# Patient Record
Sex: Male | Born: 1960 | Race: White | Hispanic: No | Marital: Married | Smoking: Former smoker
Health system: Southern US, Community
[De-identification: ages and names within clinical notes are randomized; demographics above are authoritative.]

## PROBLEM LIST (undated history)

## (undated) DIAGNOSIS — I10 Essential (primary) hypertension: Secondary | ICD-10-CM

## (undated) DIAGNOSIS — I219 Acute myocardial infarction, unspecified: Secondary | ICD-10-CM

## (undated) DIAGNOSIS — E119 Type 2 diabetes mellitus without complications: Secondary | ICD-10-CM

## (undated) HISTORY — PX: APPENDECTOMY: SHX54

---

## 2005-01-11 ENCOUNTER — Emergency Department (HOSPITAL_COMMUNITY): Admission: EM | Admit: 2005-01-11 | Discharge: 2005-01-11 | Payer: Self-pay | Admitting: Emergency Medicine

## 2005-03-13 ENCOUNTER — Emergency Department (HOSPITAL_COMMUNITY): Admission: EM | Admit: 2005-03-13 | Discharge: 2005-03-13 | Payer: Self-pay | Admitting: Emergency Medicine

## 2015-12-09 ENCOUNTER — Emergency Department (HOSPITAL_COMMUNITY)
Admission: EM | Admit: 2015-12-09 | Discharge: 2015-12-10 | Disposition: A | Discharge status: Home / Self Care | Payer: Worker's Compensation | Attending: Emergency Medicine | Admitting: Emergency Medicine

## 2015-12-09 ENCOUNTER — Encounter (HOSPITAL_COMMUNITY): Payer: Self-pay | Admitting: *Deleted

## 2015-12-09 ENCOUNTER — Emergency Department (HOSPITAL_COMMUNITY): Payer: Worker's Compensation

## 2015-12-09 DIAGNOSIS — W01198A Fall on same level from slipping, tripping and stumbling with subsequent striking against other object, initial encounter: Secondary | ICD-10-CM | POA: Insufficient documentation

## 2015-12-09 DIAGNOSIS — Y9289 Other specified places as the place of occurrence of the external cause: Secondary | ICD-10-CM | POA: Diagnosis not present

## 2015-12-09 DIAGNOSIS — Y998 Other external cause status: Secondary | ICD-10-CM | POA: Insufficient documentation

## 2015-12-09 DIAGNOSIS — Z7982 Long term (current) use of aspirin: Secondary | ICD-10-CM | POA: Insufficient documentation

## 2015-12-09 DIAGNOSIS — Z7984 Long term (current) use of oral hypoglycemic drugs: Secondary | ICD-10-CM | POA: Insufficient documentation

## 2015-12-09 DIAGNOSIS — S300XXA Contusion of lower back and pelvis, initial encounter: Secondary | ICD-10-CM | POA: Diagnosis not present

## 2015-12-09 DIAGNOSIS — M62830 Muscle spasm of back: Secondary | ICD-10-CM | POA: Insufficient documentation

## 2015-12-09 DIAGNOSIS — Z79899 Other long term (current) drug therapy: Secondary | ICD-10-CM | POA: Diagnosis not present

## 2015-12-09 DIAGNOSIS — Z87891 Personal history of nicotine dependence: Secondary | ICD-10-CM | POA: Insufficient documentation

## 2015-12-09 DIAGNOSIS — E119 Type 2 diabetes mellitus without complications: Secondary | ICD-10-CM | POA: Diagnosis not present

## 2015-12-09 DIAGNOSIS — W19XXXA Unspecified fall, initial encounter: Secondary | ICD-10-CM

## 2015-12-09 DIAGNOSIS — Y9389 Activity, other specified: Secondary | ICD-10-CM | POA: Insufficient documentation

## 2015-12-09 DIAGNOSIS — Z7902 Long term (current) use of antithrombotics/antiplatelets: Secondary | ICD-10-CM | POA: Diagnosis not present

## 2015-12-09 DIAGNOSIS — S3992XA Unspecified injury of lower back, initial encounter: Secondary | ICD-10-CM | POA: Diagnosis present

## 2015-12-09 DIAGNOSIS — I10 Essential (primary) hypertension: Secondary | ICD-10-CM | POA: Diagnosis not present

## 2015-12-09 DIAGNOSIS — I252 Old myocardial infarction: Secondary | ICD-10-CM | POA: Insufficient documentation

## 2015-12-09 HISTORY — DX: Type 2 diabetes mellitus without complications: E11.9

## 2015-12-09 HISTORY — DX: Acute myocardial infarction, unspecified: I21.9

## 2015-12-09 HISTORY — DX: Essential (primary) hypertension: I10

## 2015-12-09 MED ORDER — HYDROMORPHONE HCL 1 MG/ML IJ SOLN
1.0000 mg | Freq: Once | INTRAMUSCULAR | Status: AC
  Administered 2015-12-09: 1 mg via INTRAVENOUS
  Filled 2015-12-09: qty 1

## 2015-12-09 MED ORDER — ONDANSETRON HCL 4 MG/2ML IJ SOLN
4.0000 mg | Freq: Once | INTRAMUSCULAR | Status: AC
  Administered 2015-12-09: 4 mg via INTRAVENOUS
  Filled 2015-12-09: qty 2

## 2015-12-09 MED ORDER — DIAZEPAM 5 MG/ML IJ SOLN
5.0000 mg | Freq: Once | INTRAMUSCULAR | Status: AC
  Administered 2015-12-09: 5 mg via INTRAVENOUS
  Filled 2015-12-09: qty 2

## 2015-12-09 NOTE — ED Notes (Addendum)
Per [facility], pt fell & c/o LBP & left flank pain.  Pt received fentanyl enroute

## 2015-12-09 NOTE — ED Notes (Signed)
Patient transported to X-ray 

## 2015-12-09 NOTE — ED Notes (Signed)
Bed: WA02 Expected date:  Expected time:  Means of arrival:  Comments: EMS 55 yo male fall

## 2015-12-09 NOTE — ED Provider Notes (Signed)
CSN: [id number]     Arrival date & time 12/09/15  2201 History   First MD Initiated Contact with Patient 12/09/15 2236     Chief Complaint  Patient presents with  . Fall  . Back Pain   HPI  [name] is an 55 y.o. male with history of HTN, DM, MI who presents to the ED for evaluation of back pain after a fall. He states he was standing on a pickup truck and took a step back, not realizing he was at the edge of the be. He states he fell at an angle and hit his lower back but more on the left side. He states initially he was able to walk back inside but once he laid down the pain was so severe he could not get out of bed for two hours. Denies numbness or weakness of his leg. Denies bowel/bladder incontinence. Denies saddle paresthesias. Denies hitting his head, LOC. Denies headache or dizziness.  Past Medical History  Diagnosis Date  . Hypertension   . Diabetes mellitus without complication (HCC)   . MI (myocardial infarction) [facility](HCC)     Pt stated "it was in June or July 2016"   Past Surgical History  Procedure Laterality Date  . Appendectomy     No family history on file. Social History  Substance Use Topics  . Smoking status: Former Games developermoker  . Smokeless tobacco: None  . Alcohol Use: No    Review of Systems  All other systems reviewed and are negative.     Allergies  Other  Home Medications   Prior to Admission medications   Medication Sig Start Date End Date Taking? Authorizing Provider  aspirin 81 MG tablet Take 81 mg by mouth daily.   Yes Historical Provider, MD  Benzalkonium Chloride (MERTHIOLATE EX) Apply 1 application topically daily as needed (wound care).   Yes Historical Provider, MD  Cinnamon 500 MG TABS Take 1,000 mg by mouth 2 (two) times daily.   Yes Historical Provider, MD  clopidogrel (PLAVIX) 75 MG tablet Take 75 mg by mouth daily.   Yes Historical Provider, MD  isosorbide mononitrate (IMDUR) 30 MG 24 hr tablet Take 30 mg by mouth daily.   Yes Historical  Provider, MD  lovastatin (MEVACOR) 40 MG tablet Take 40 mg by mouth daily.   Yes Historical Provider, MD  metFORMIN (GLUCOPHAGE) 1000 MG tablet Take 1,000 mg by mouth 2 (two) times daily with a meal.   Yes Historical Provider, MD  nitroGLYCERIN (NITROSTAT) 0.4 MG SL tablet Place 0.4 mg under the tongue every 5 (five) minutes as needed for chest pain.   Yes Historical Provider, MD  omeprazole (PRILOSEC) 40 MG capsule Take 40 mg by mouth daily.   Yes Historical Provider, MD  valsartan-hydrochlorothiazide (DIOVAN-HCT) 80-12.5 MG tablet Take 1 tablet by mouth daily.   Yes Historical Provider, MD   BP 111/70 mmHg  Pulse 85  Temp(Src) 98.4 F (36.9 C) (Oral)  Resp 18  Ht 5\' 8"  (1.727 m)  Wt 112.492 kg  BMI 37.72 kg/m2  SpO2 99% Physical Exam  Constitutional: He is oriented to person, place, and time.  Appears very uncomfortable  HENT:  Right Ear: External ear normal.  Left Ear: External ear normal.  Nose: Nose normal.  Mouth/Throat: Oropharynx is clear and moist. No oropharyngeal exudate.  Eyes: Conjunctivae and EOM are normal. Pupils are equal, round, and reactive to light.  Neck: Normal range of motion. Neck supple.  Cardiovascular: Normal rate, regular rhythm, normal heart  sounds and intact distal pulses.   Pulmonary/Chest: Effort normal and breath sounds normal. No respiratory distress. He exhibits no tenderness.  Abdominal: Soft. Bowel sounds are normal. He exhibits no distension. There is no tenderness.  Musculoskeletal:  Marked l-spine midline tenderness and left paraspinal tenderness with spasm. No stepoff or deformity.   Neurological: He is alert and oriented to person, place, and time. He has normal reflexes. No cranial nerve deficit.  UE and LE strength and sensation intact bilaterally. Normal finger to nose.   Skin: Skin is warm and dry.  Psychiatric: He has a normal mood and affect.  Nursing note and vitals reviewed.   ED Course  Procedures (including critical care  time) Labs Review Labs Reviewed - No data to display  Imaging Review Dg Lumbar Spine Complete  12/10/2015  CLINICAL DATA:  Patient fell backwards 3 feet off of the tail gait of a truck. Low back pain worse on the left. EXAM: LUMBAR SPINE - COMPLETE 4+ VIEW COMPARISON:  None. FINDINGS: Normal alignment of the lumbar spine. Hypertrophic degenerative changes at the endplates with mildly narrowed lumbar interspaces. Degenerative changes in the facet joints. No vertebral compression deformities. No focal bone lesion or bone destruction. Bone cortex appears intact. Visualize sacrum appears intact. Surgical clips in the right lower quadrant. Mild degenerative changes in the hips. IMPRESSION: Mild degenerative changes in the lumbar spine. No acute displaced fractures identified. Electronically Signed   By: Burman Nieves M.D.   On: 12/10/2015 00:10   I have personally reviewed and evaluated these images and lab results as part of my medical decision-making.   EKG Interpretation None      MDM   Final diagnoses:  Fall, initial encounter  Contusion of lower back, initial encounter    XR negative for acute findings. Neuro exam nonfocal. Pain mildly improved with initial dose of pain meds. Will order another dilaudid, add toradol.   Pt reports significant improvement in pain after second dilaudid and toradol, though still quite tender. He is also concerned that he will not be able to pick up meds until AM. Will give additional dilaudid and give one dose PO Percocet to hopefully give enough relief to tie pt over until the AM when he can pick up meds. Will give rx for percocet, valium, and naproxen. Pt able to ambulate unassisted with steady gait. He is stable for discharge. Encouraged PCP f/u when pt returns home in [place]. ER return precautions given.   Carlene Coria, PA-C 12/10/15 1500  Tilden Fossa, MD 12/15/15 [phone]

## 2015-12-10 MED ORDER — DIAZEPAM 5 MG PO TABS: 5.0000 mg | ORAL_TABLET | Freq: Two times a day (BID) | ORAL | Status: AC

## 2015-12-10 MED ORDER — HYDROMORPHONE HCL 1 MG/ML IJ SOLN
1.0000 mg | Freq: Once | INTRAMUSCULAR | Status: AC
  Administered 2015-12-10: 1 mg via INTRAVENOUS
  Filled 2015-12-10: qty 1

## 2015-12-10 MED ORDER — KETOROLAC TROMETHAMINE 30 MG/ML IJ SOLN
30.0000 mg | Freq: Once | INTRAMUSCULAR | Status: AC
  Administered 2015-12-10: 30 mg via INTRAVENOUS
  Filled 2015-12-10: qty 1

## 2015-12-10 MED ORDER — OXYCODONE-ACETAMINOPHEN 5-325 MG PO TABS: 1.0000 | ORAL_TABLET | ORAL | Status: AC | PRN

## 2015-12-10 MED ORDER — NAPROXEN 500 MG PO TABS: 500.0000 mg | ORAL_TABLET | Freq: Two times a day (BID) | ORAL | Status: AC

## 2015-12-10 MED ORDER — OXYCODONE-ACETAMINOPHEN 5-325 MG PO TABS
1.0000 | ORAL_TABLET | Freq: Once | ORAL | Status: AC
  Administered 2015-12-10: 1 via ORAL
  Filled 2015-12-10: qty 1

## 2015-12-10 NOTE — Discharge Instructions (Signed)
Your x-ray today showed no evidence of fracture or dislocation. We were able to control your pain in the ER. Please follow up with your primary care provider as soon as possible when you return to [place]. In the meantime I will give you a few prescriptions to help with your pain. Return to the ER for new or worsening symptoms.

## 2015-12-10 NOTE — ED Notes (Signed)
Pt ambulated to BR

## 2017-12-23 IMAGING — CR DG LUMBAR SPINE COMPLETE 4+V
5 series · 5 of 5 positions shown · non-contrast
Comparison: None.

CLINICAL DATA: Patient fell backwards 3 feet off of the tail gait
of a truck. Low back pain worse on the left.

EXAM:
LUMBAR SPINE - COMPLETE 4+ VIEW

[t lumbar spine ap]
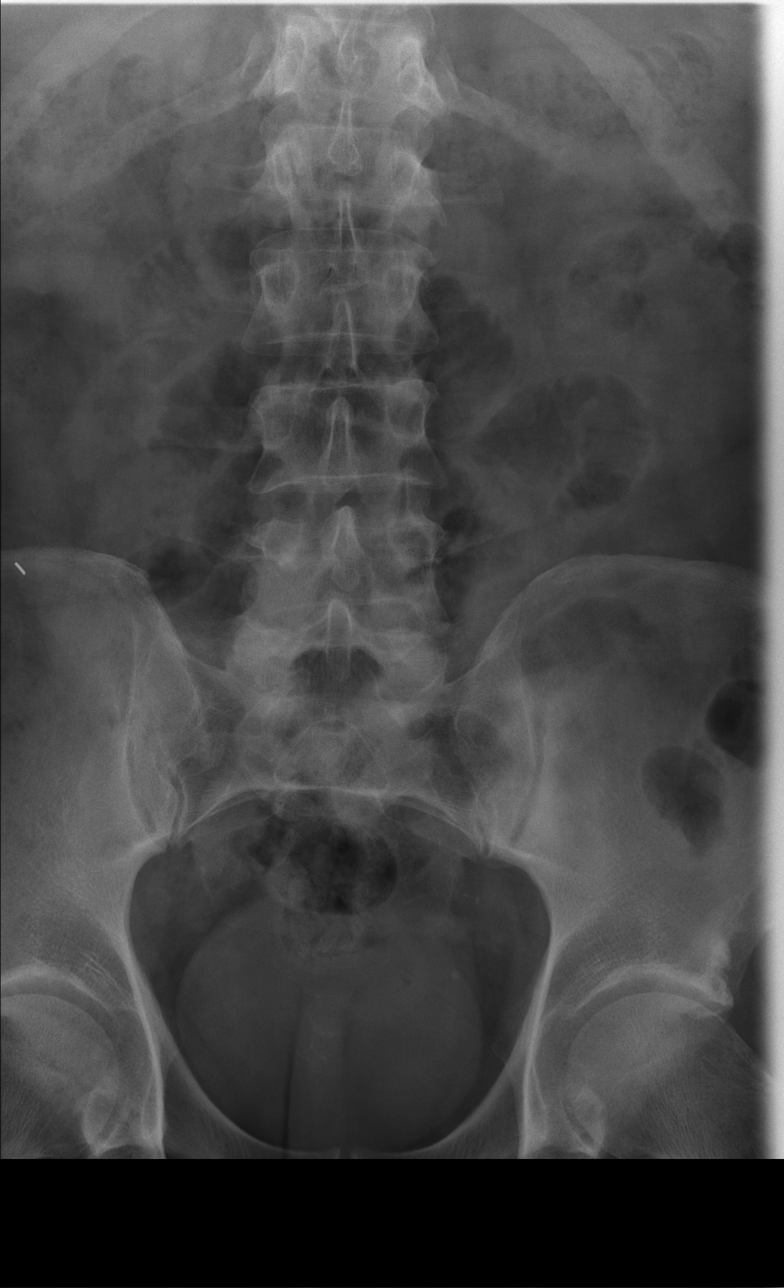

[t lumbar spine obl (1 of 2)]
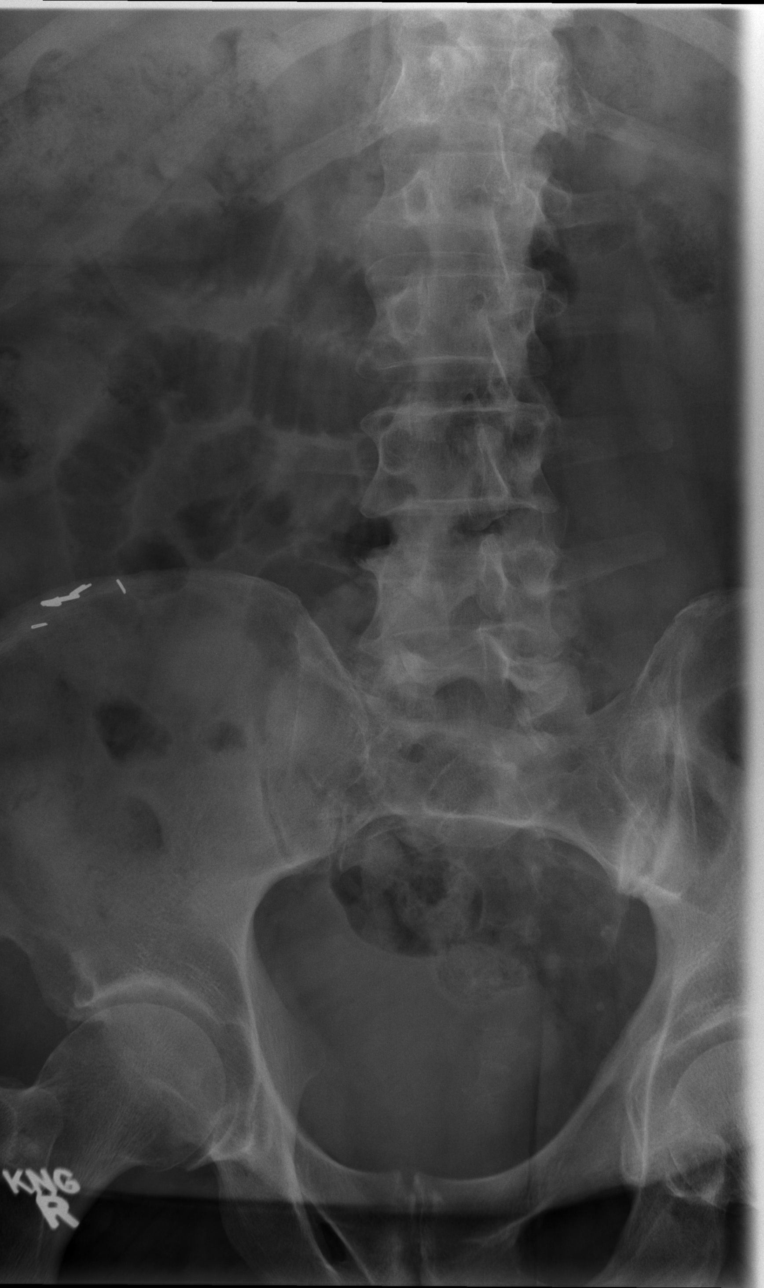

[t lumbar spine obl (2 of 2)]
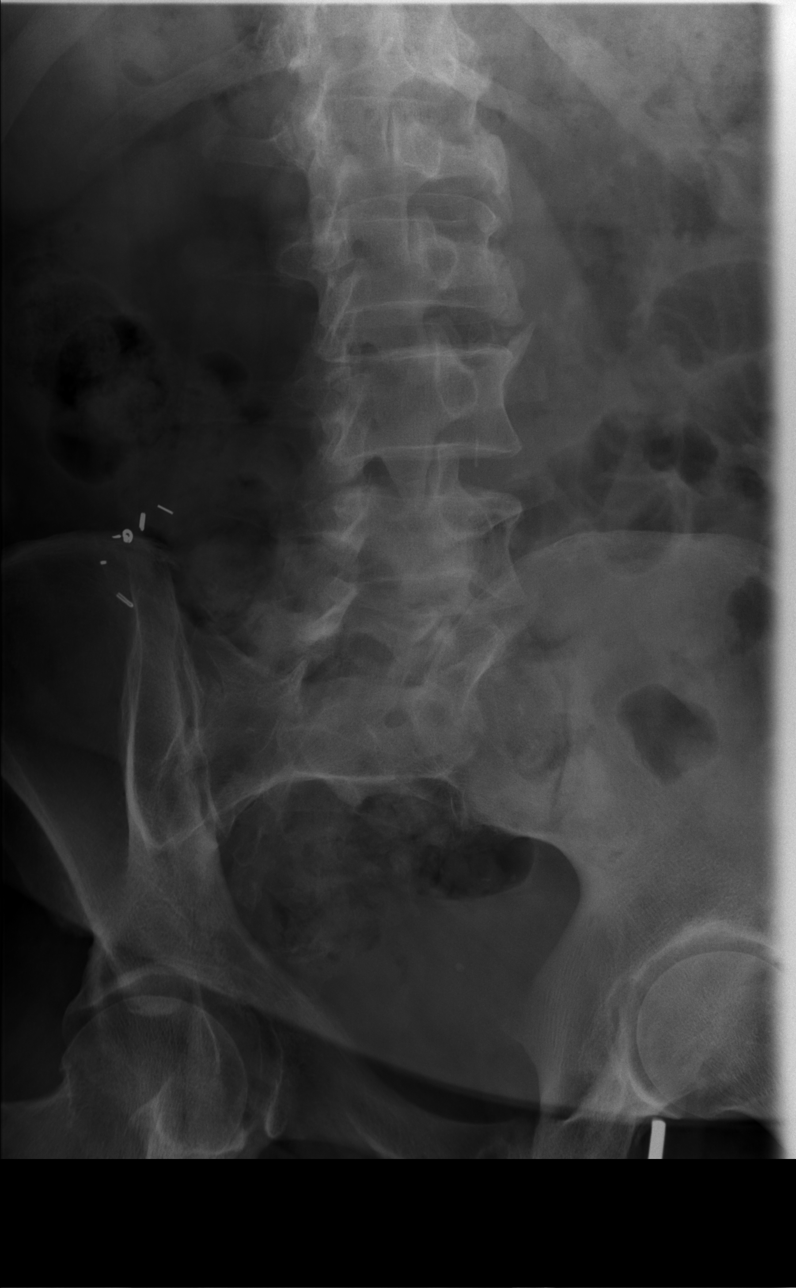

[t lumbar spine lat]
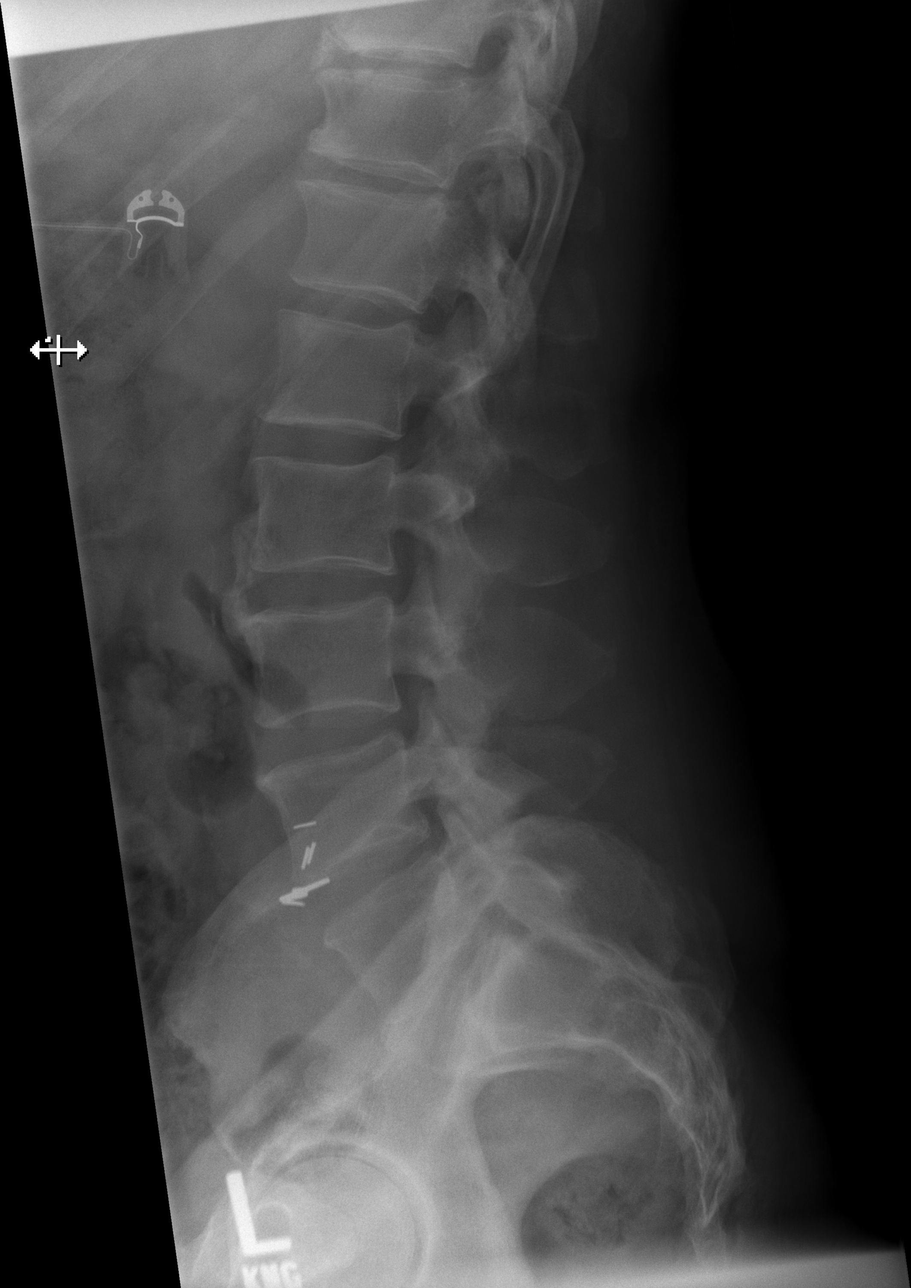

[t lumbar l-5 s-1 spot]
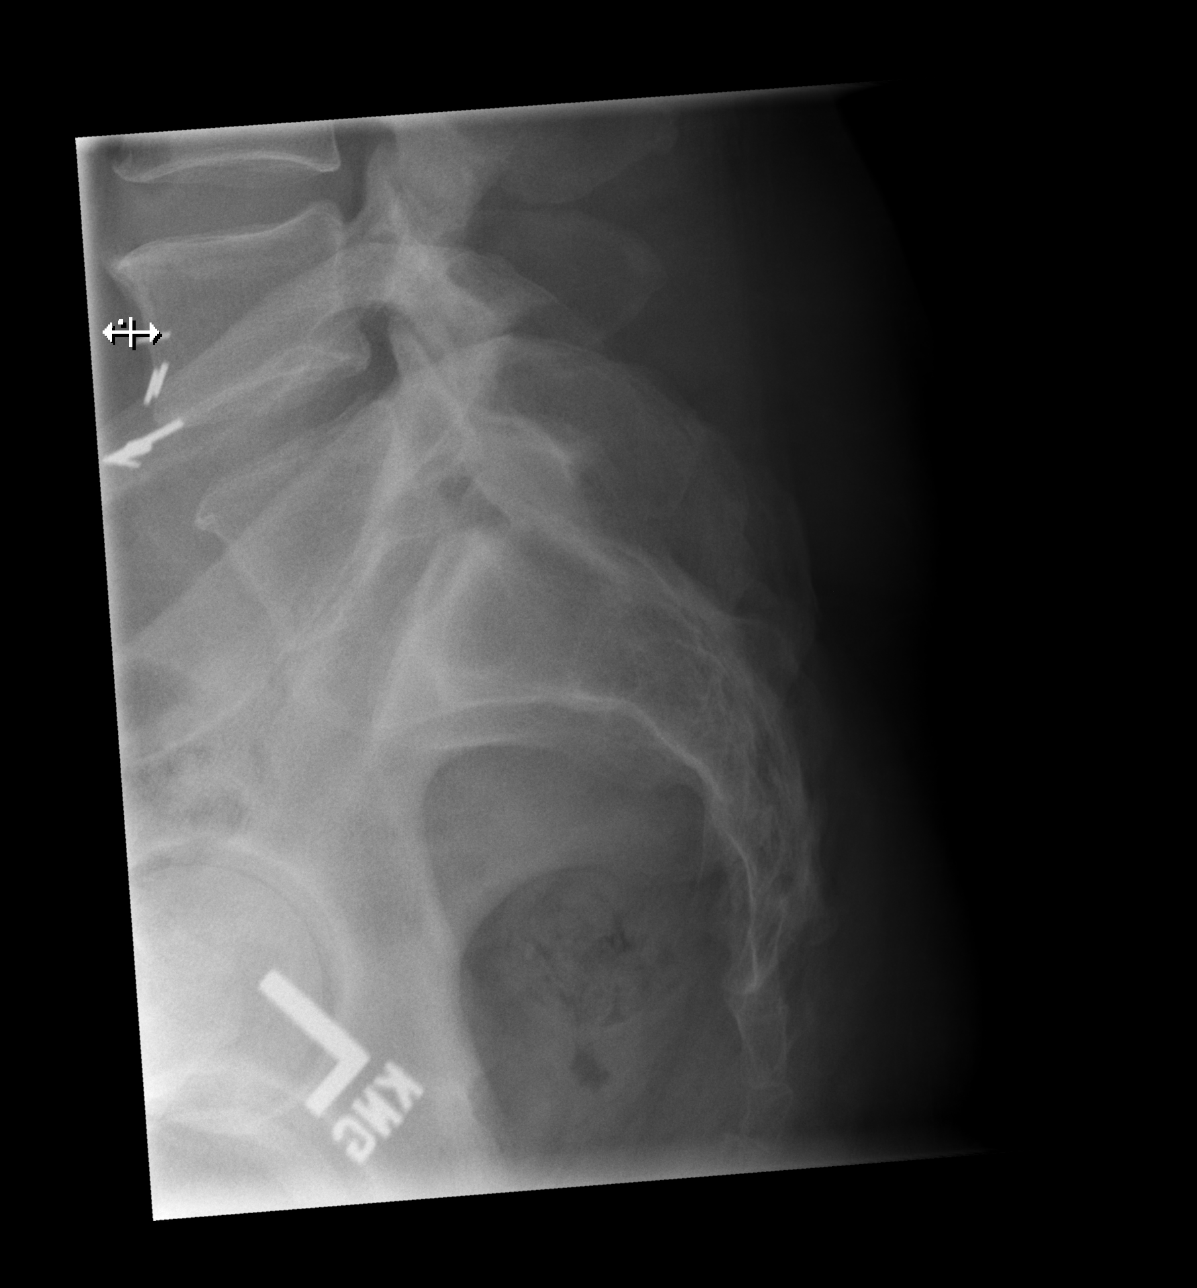

[5 of 5 positions shown; findings below may reference images not displayed]

FINDINGS: Normal alignment of the lumbar spine. Hypertrophic degenerative
changes at the endplates with mildly narrowed lumbar interspaces.
Degenerative changes in the facet joints. No vertebral compression
deformities. No focal bone lesion or bone destruction. Bone cortex
appears intact. Visualize sacrum appears intact. Surgical clips in
the right lower quadrant. Mild degenerative changes in the hips.
IMPRESSION: Mild degenerative changes in the lumbar spine. No acute displaced
fractures identified.
